# Patient Record
Sex: Male | Born: 1940 | Race: White | Hispanic: No | Marital: Married | Smoking: Former smoker
Health system: Southern US, Community
[De-identification: ages and names within clinical notes are randomized; demographics above are authoritative.]

## PROBLEM LIST (undated history)

## (undated) DIAGNOSIS — I509 Heart failure, unspecified: Secondary | ICD-10-CM

## (undated) DIAGNOSIS — I7409 Other arterial embolism and thrombosis of abdominal aorta: Secondary | ICD-10-CM

## (undated) DIAGNOSIS — I679 Cerebrovascular disease, unspecified: Secondary | ICD-10-CM

## (undated) DIAGNOSIS — D510 Vitamin B12 deficiency anemia due to intrinsic factor deficiency: Secondary | ICD-10-CM

## (undated) DIAGNOSIS — Z87898 Personal history of other specified conditions: Secondary | ICD-10-CM

## (undated) DIAGNOSIS — E785 Hyperlipidemia, unspecified: Secondary | ICD-10-CM

## (undated) DIAGNOSIS — I2510 Atherosclerotic heart disease of native coronary artery without angina pectoris: Secondary | ICD-10-CM

## (undated) DIAGNOSIS — I739 Peripheral vascular disease, unspecified: Secondary | ICD-10-CM

## (undated) DIAGNOSIS — E871 Hypo-osmolality and hyponatremia: Secondary | ICD-10-CM

## (undated) DIAGNOSIS — I10 Essential (primary) hypertension: Secondary | ICD-10-CM

## (undated) HISTORY — PX: CAROTID ENDARTERECTOMY: SUR193

## (undated) HISTORY — DX: Cerebrovascular disease, unspecified: I67.9

## (undated) HISTORY — DX: Other arterial embolism and thrombosis of abdominal aorta: I74.09

## (undated) HISTORY — DX: Heart failure, unspecified: I50.9

## (undated) HISTORY — PX: ELBOW SURGERY: SHX618

## (undated) HISTORY — PX: CARDIAC CATHETERIZATION: SHX172

## (undated) HISTORY — DX: Vitamin B12 deficiency anemia due to intrinsic factor deficiency: D51.0

## (undated) HISTORY — DX: Hypo-osmolality and hyponatremia: E87.1

## (undated) HISTORY — DX: Peripheral vascular disease, unspecified: I73.9

## (undated) HISTORY — DX: Essential (primary) hypertension: I10

## (undated) HISTORY — DX: Hyperlipidemia, unspecified: E78.5

## (undated) HISTORY — DX: Atherosclerotic heart disease of native coronary artery without angina pectoris: I25.10

## (undated) HISTORY — DX: Personal history of other specified conditions: Z87.898

## (undated) HISTORY — PX: KNEE SURGERY: SHX244

---

## 1992-12-27 HISTORY — PX: CORONARY ARTERY BYPASS GRAFT: SHX141

## 2004-12-27 HISTORY — PX: ILIAC ARTERY STENT: SHX1786

## 2005-02-16 ENCOUNTER — Ambulatory Visit: Payer: Self-pay | Admitting: Surgery

## 2005-03-10 ENCOUNTER — Ambulatory Visit: Payer: Self-pay | Admitting: Vascular Surgery

## 2005-03-11 ENCOUNTER — Ambulatory Visit: Payer: Self-pay | Admitting: Vascular Surgery

## 2005-03-19 ENCOUNTER — Ambulatory Visit: Payer: Self-pay | Admitting: Vascular Surgery

## 2010-03-20 ENCOUNTER — Inpatient Hospital Stay: Payer: Self-pay | Admitting: Orthopedic Surgery

## 2010-03-23 ENCOUNTER — Inpatient Hospital Stay: Payer: Self-pay | Admitting: Surgery

## 2010-11-02 ENCOUNTER — Ambulatory Visit: Payer: Self-pay | Admitting: Unknown Physician Specialty

## 2010-11-03 ENCOUNTER — Ambulatory Visit: Payer: Self-pay | Admitting: Orthopedic Surgery

## 2010-12-23 ENCOUNTER — Inpatient Hospital Stay: Payer: Self-pay | Admitting: General Surgery

## 2010-12-23 ENCOUNTER — Ambulatory Visit: Payer: Self-pay | Admitting: Internal Medicine

## 2010-12-27 HISTORY — PX: CHOLECYSTECTOMY: SHX55

## 2011-06-15 IMAGING — CR DG KNEE 1-2V*L*
1 series · 2 of 2 positions shown · non-contrast
Comparison: none

REASON FOR EXAM: post op
COMMENTS:   Bedside (portable):Y

[Series 1: view not recorded · 0.17mm/px · 2 of 2 slices shown]
[im 1/2]
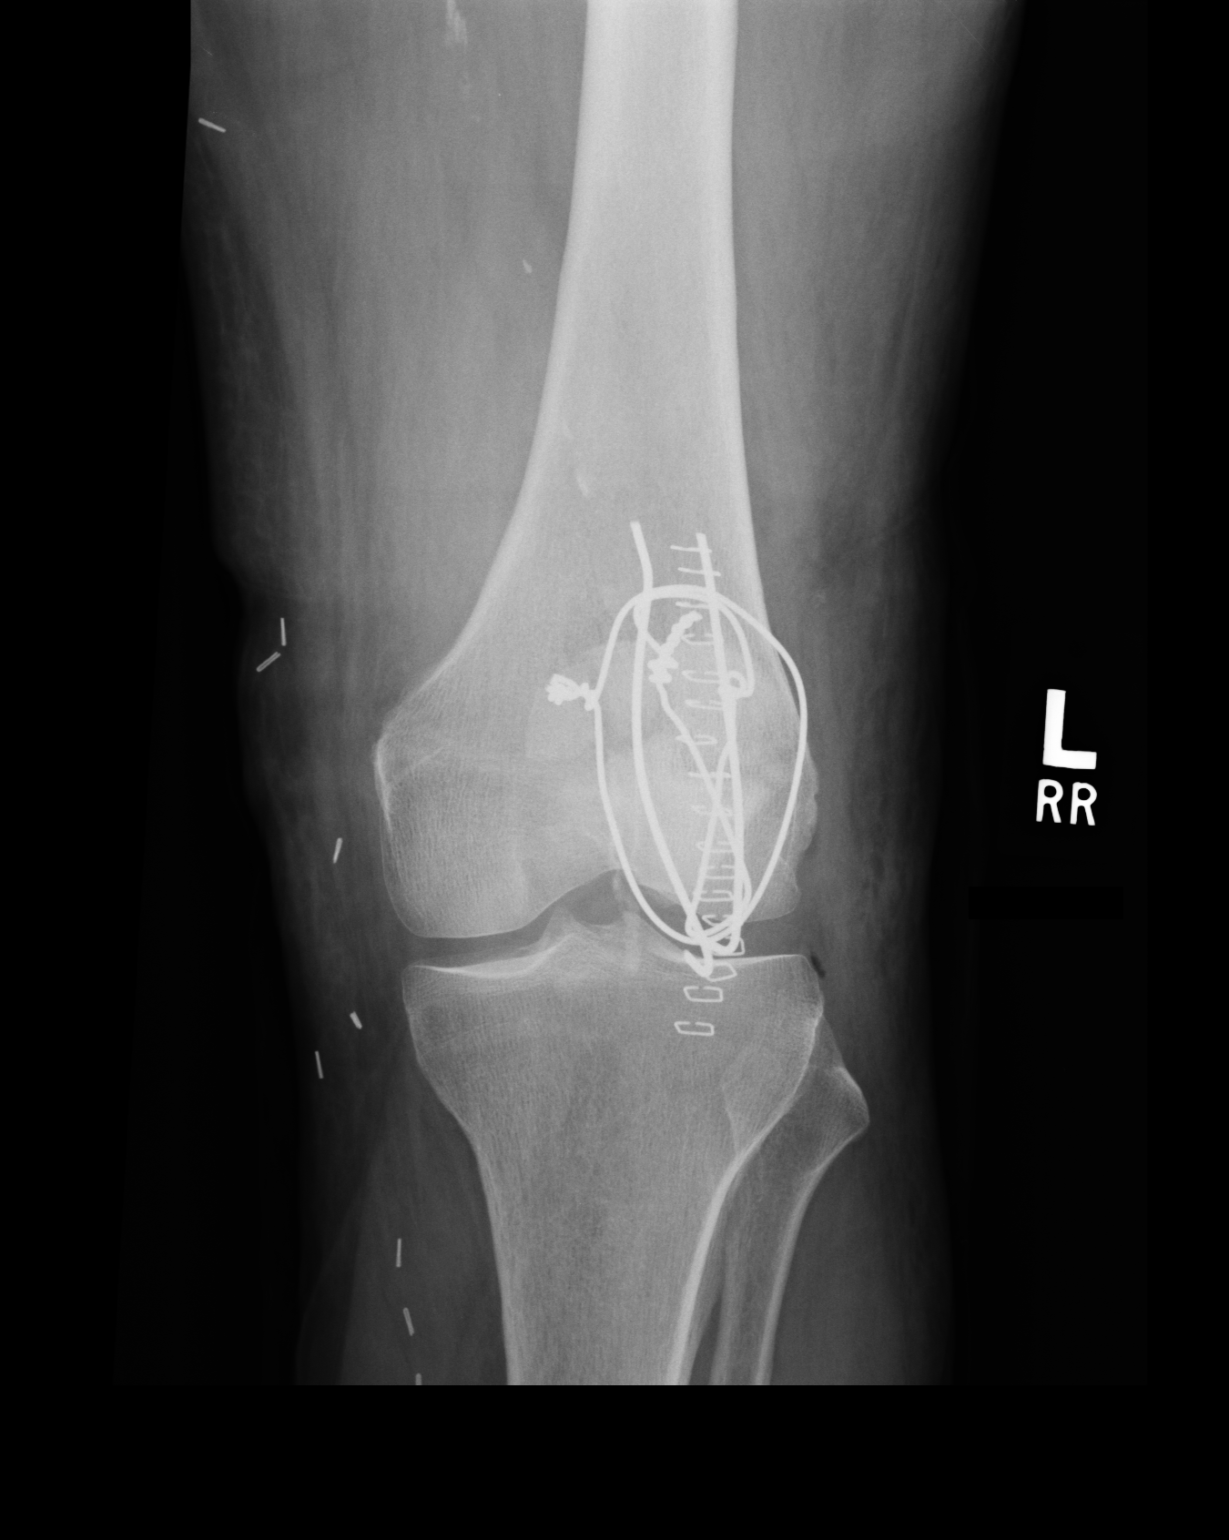
[im 2/2]
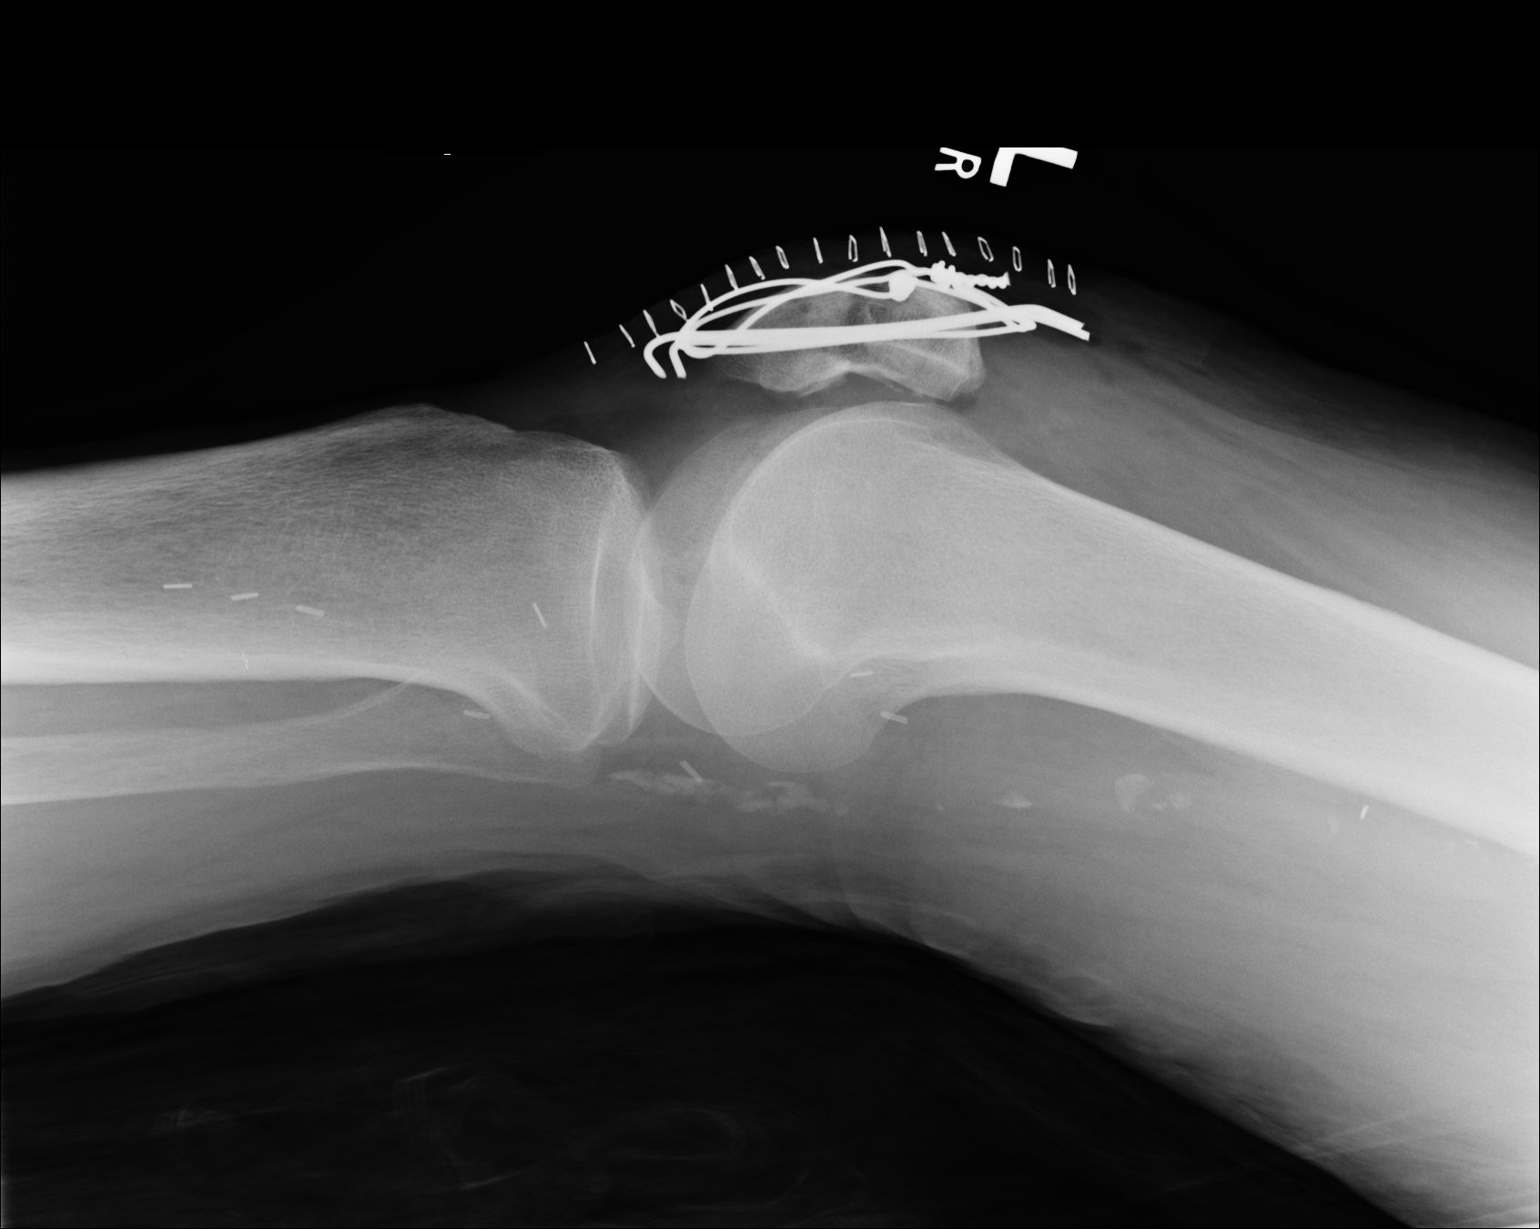

[2 of 2 positions shown; findings below may reference images not displayed]

PROCEDURE:     DXR - DXR KNEE LEFT AP AND LATERAL  - March 20, 2010  [DATE]

RESULT:     There has been wire fixation of the patient's patellar fracture.
Hardware appears intact. The remaining osseous structures appear gross
unremarkable. Skin staples are appreciated about the anterior aspect of the
knee.
IMPRESSION: Open reduction internal fixation of a left knee fracture. The remainder of
the interpretation will be left to the performing physician.

## 2012-03-19 IMAGING — CT CT ABD-PELV W/ CM
1 of 2 series · 15 of 32 positions shown, 19 images · IV contrast (isovue)
Comparison: None

REASON FOR EXAM: CR [id number]  RLQ pain eval appendicitis
COMMENTS:

PROCEDURE:     CT  - CT ABDOMEN / PELVIS  W  - December 23, 2010  [DATE]
RESULT:     History: Right lower quadrant pain
TECHNIQUE: Multiple axial images of the abdomen and pelvis were performed
from the lung bases to the pubic symphysis, with p.o. contrast and with 85
mL of Isovue 370 intravenous contrast.

[Series 2: soft tissue · axial · 0.72mm/px · z∈[+110,+538]mm · 15 of 155 slices shown, 19 images]
[im 6/155  soft-tissue]
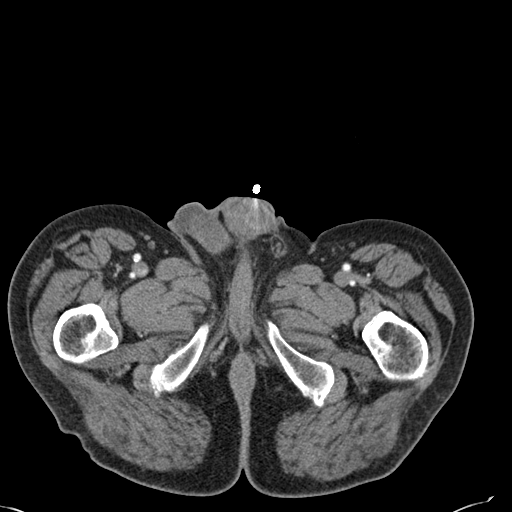
[im 6/155  bone]
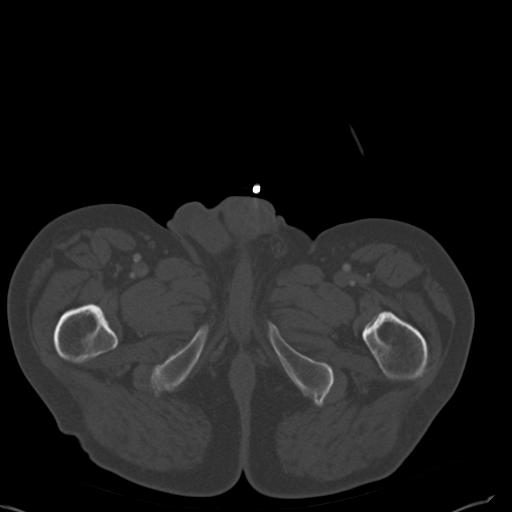
[im 18/155  soft-tissue]
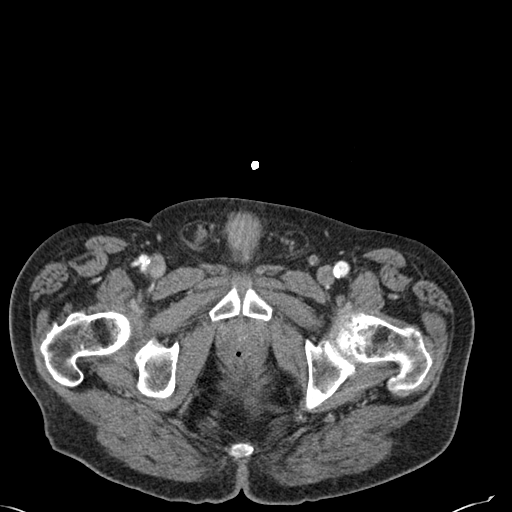
[im 30/155  soft-tissue]
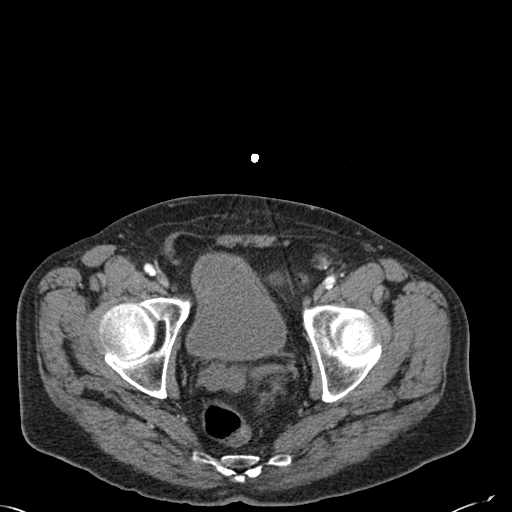
[im 42/155  soft-tissue]
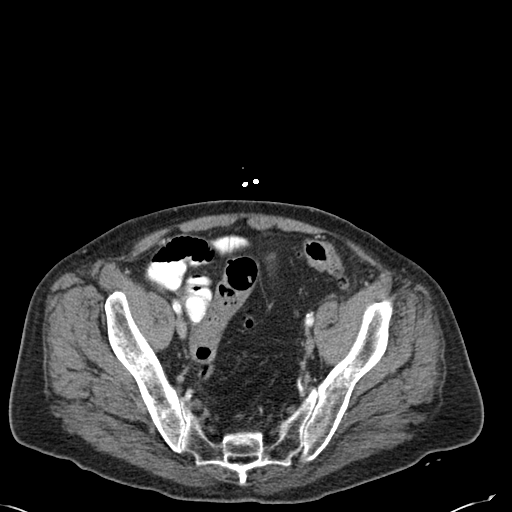
[im 54/155  soft-tissue]
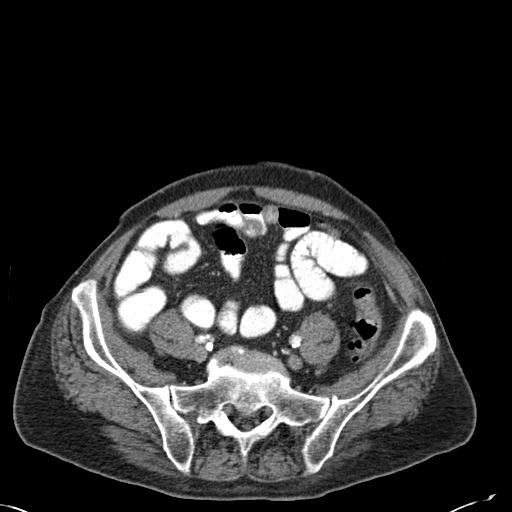
[im 66/155  soft-tissue]
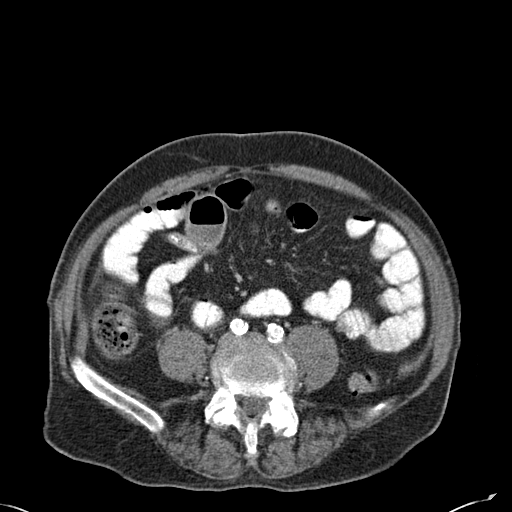
[im 78/155  soft-tissue]
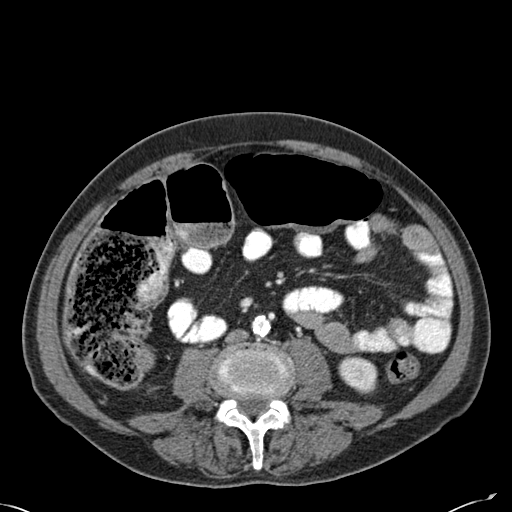
[im 89/155  soft-tissue]
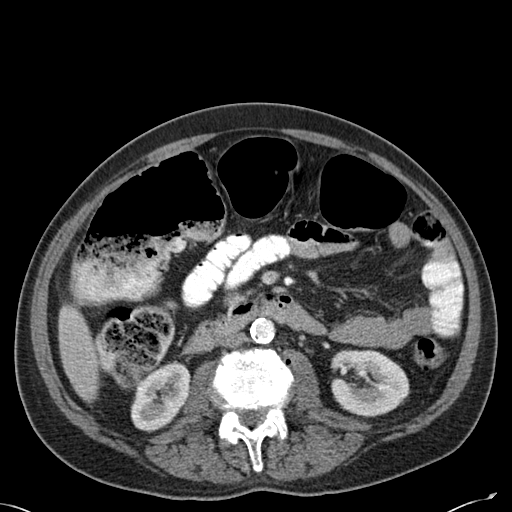
[im 101/155  soft-tissue]
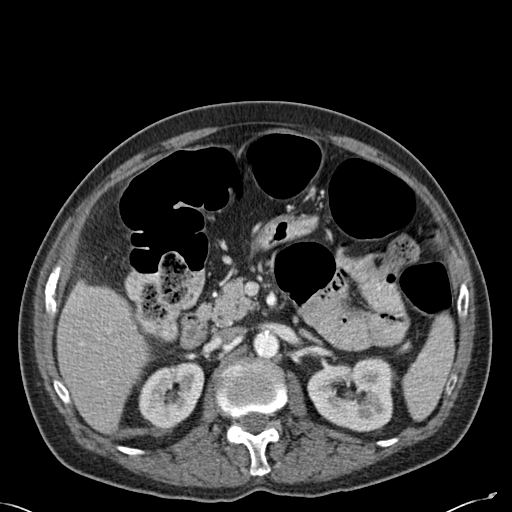
[im 101/155  bone]
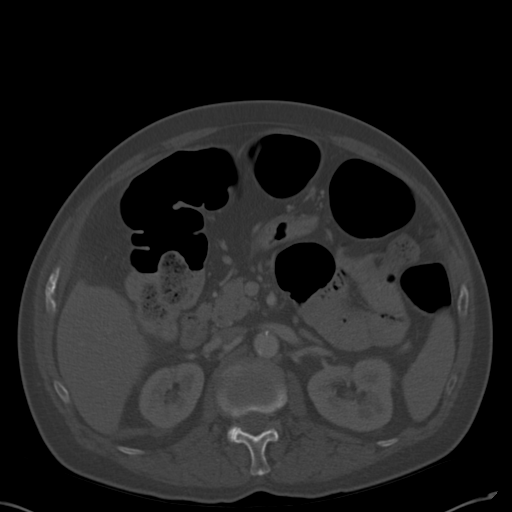
[im 113/155  soft-tissue]
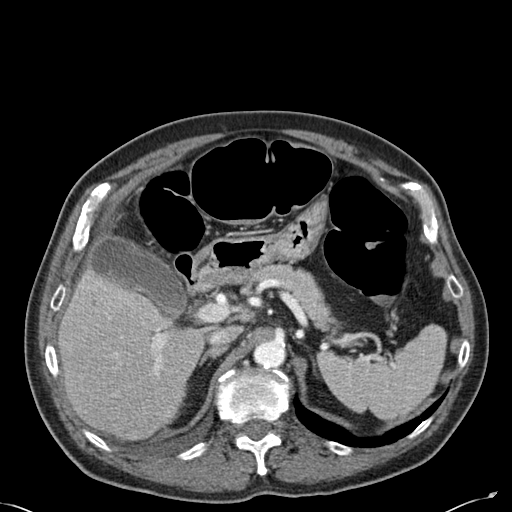
[im 125/155  soft-tissue]
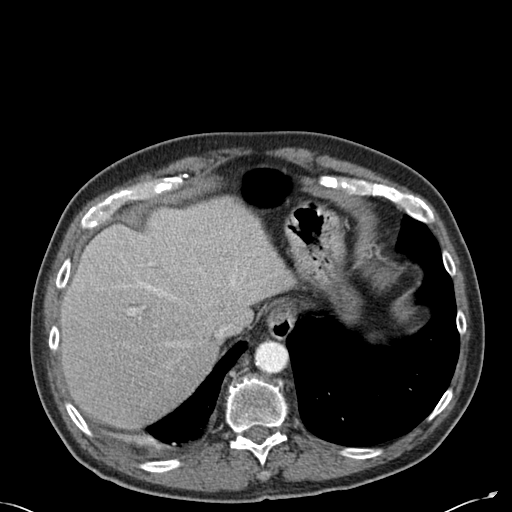
[im 131/155  lung]
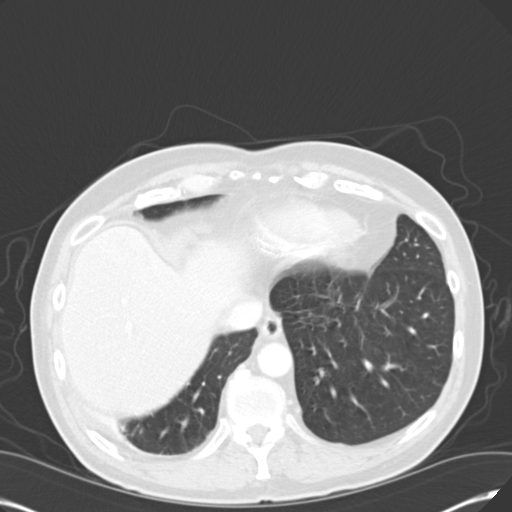
[im 137/155  soft-tissue]
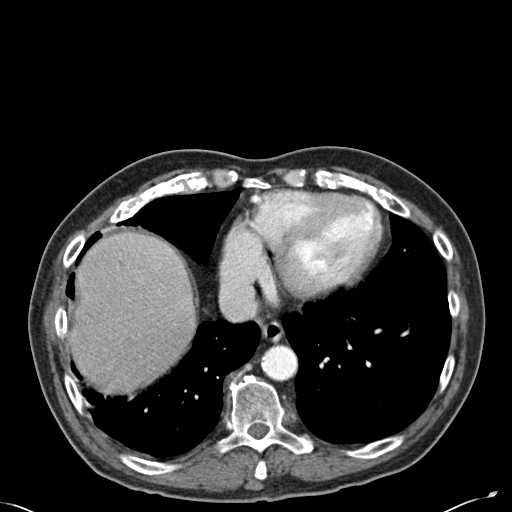
[im 137/155  lung]
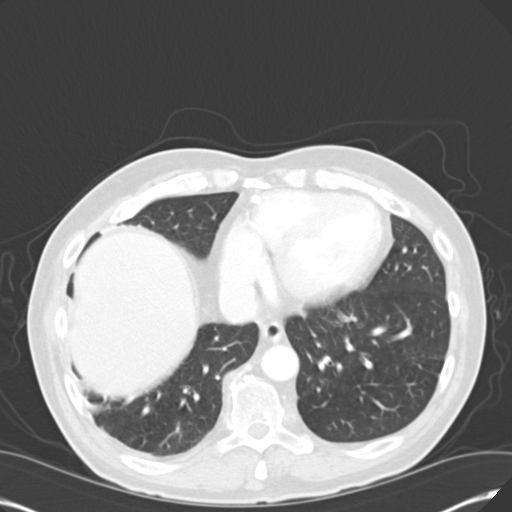
[im 143/155  lung]
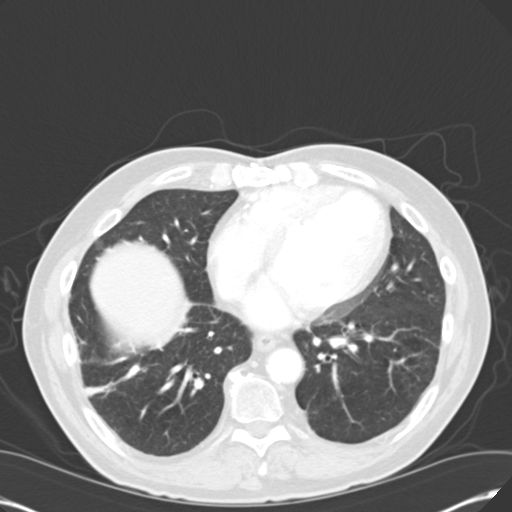
[im 149/155  soft-tissue]
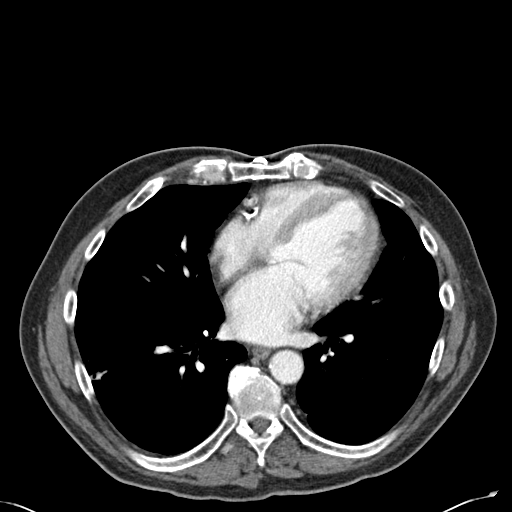
[im 149/155  lung]
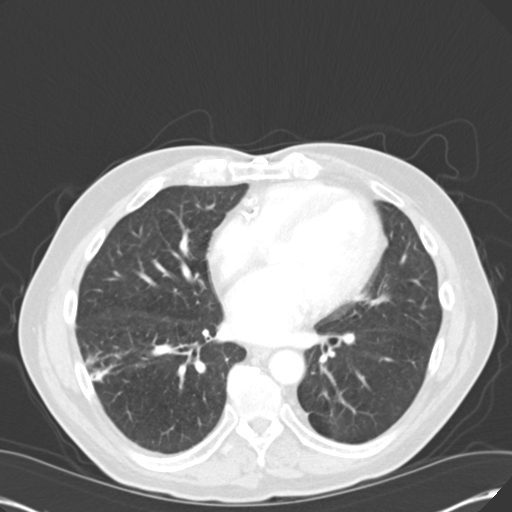

[15 of 32 positions shown; findings below may reference images not displayed]

FINDINGS: Small right pleural effusion. There is no pneumothorax. The heart size is
normal.

The liver demonstrates no focal abnormality. There is no intrahepatic or
extrahepatic biliary ductal dilatation. The gallbladder is distended with
mild haziness surrounding the gallbladder.. The spleen demonstrates no focal
abnormality. The kidneys, adrenal glands, and pancreas are normal. The
bladder is unremarkable.

There is gaseous distention of the ascending, transverse and proximal
descending colon with a relative change in caliber at the mid descending
colon without an obvious mass. There is diverticulosis without evidence of
diverticulitis. No normal nor abnormal appendix is identified. There is no
pneumoperitoneum, pneumatosis, or portal venous gas. There is no abdominal
or pelvic free fluid. There is no lymphadenopathy.

The abdominal aorta is normal in caliber with atherosclerosis.

The osseous structures are unremarkable.
IMPRESSION: 1. Distended gallbladder with mild haziness surrounding the gallbladder as
can be seen with cholecystitis. Recommend further evaluation with a right
upper quadrant ultrasound.

2. No normal nor abnormal appendix is identified. There are no secondary
findings to suggest acute appendicitis.

3. Small right pleural effusion.

4.There is gaseous distention of the ascending, transverse and proximal
descending colon with a relative change in caliber at the mid descending
colon without an obvious mass. Recommend correlation with a coloscopy.

## 2015-06-24 ENCOUNTER — Telehealth: Payer: Self-pay | Admitting: Surgery

## 2015-06-24 NOTE — Telephone Encounter (Signed)
I have called pt to reschedule appt for 07/11/15 with Dr Michela PitcherEly. Dr Michela PitcherEly will be out of the office. No answer. I was able to leave a VM.

## 2015-07-11 ENCOUNTER — Ambulatory Visit: Payer: Self-pay | Admitting: Surgery

## 2015-07-21 ENCOUNTER — Other Ambulatory Visit: Payer: Self-pay | Admitting: *Deleted

## 2015-07-21 ENCOUNTER — Encounter: Payer: Self-pay | Admitting: *Deleted

## 2015-07-22 ENCOUNTER — Ambulatory Visit (INDEPENDENT_AMBULATORY_CARE_PROVIDER_SITE_OTHER): Payer: Medicare Other | Admitting: Surgery

## 2015-07-22 ENCOUNTER — Encounter: Payer: Self-pay | Admitting: Surgery

## 2015-07-22 ENCOUNTER — Encounter (INDEPENDENT_AMBULATORY_CARE_PROVIDER_SITE_OTHER): Payer: Self-pay

## 2015-07-22 VITALS — BP 141/76 | HR 67 | Temp 97.8°F | Ht 65.0 in | Wt 160.0 lb

## 2015-07-22 DIAGNOSIS — L729 Follicular cyst of the skin and subcutaneous tissue, unspecified: Secondary | ICD-10-CM | POA: Diagnosis not present

## 2015-07-22 NOTE — Patient Instructions (Signed)
Call our office with any further questions or concerns.   No need to follow-up unless your symptoms change or this cyst becomes more bothersome to you.

## 2015-07-22 NOTE — Progress Notes (Signed)
  Surgical Consultation  07/22/2015  Roy Macdonald is an 74 y.o. male. With a small skin cyst behind his right ear.  Chief Complaint  Patient presents with  . Cyst    Behind Right Ear     HPI: He noted several months ago development of a small nodule behind his right ear. He had a previous skin cyst removed there over 50 years ago. This new lesion arose within the last couple of months. It is nontender has not been infected and has not drained any blood or purulent material. He wanted a second opinion on possible excision.  Past Medical History  Diagnosis Date  . CAD (coronary artery disease)   . Hypertension   . Hyperlipidemia   . Peripheral vascular disease   . Cerebrovascular disease      bilateral carotid stenosis  . History of seizures   . Pernicious anemia   . Congestive heart failure   . Hyponatremia   . Aorto-iliac disease     Past Surgical History  Procedure Laterality Date  . Cardiac catheterization    . Coronary artery bypass graft  1994    4 vessels  . Knee surgery    . Elbow surgery    . Cholecystectomy  2012  . Carotid endarterectomy Bilateral 2012, 2013  . Iliac artery stent  2006    Family History  Problem Relation Age of Onset  . Hypertension Mother   . Heart disease Mother   . Heart disease Father     Social History:  reports that he quit smoking about 4 years ago. He has never used smokeless tobacco. He reports that he does not drink alcohol or use illicit drugs.  Allergies: No Known Allergies  Medications reviewed.     ROS he has no other new complaints at the present time. 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     BP 141/76 mmHg  Pulse 67  Temp(Src) 97.8 F (36.6 C) (Oral)  Ht  (1.651 m)  Wt 160 lb (72.576 kg)  BMI 26.63 kg/m2  Physical Exam the lesion on his ear is approximately 2 cm long and 1 cm wide. It is easily movable and does not appear to be  attached to the deep tissue. There is no evidence of any infection or surrounding erythema.    No results found for this or any previous visit (from the past 48 hour(s)). No results found.  Assessment/Plan: 1. Skin cyst We talked about the options. It would be fairly simple to remove this but in view of its location I would recommend a same day surgery procedure. He did not want to go through that much surgery. I told him I think this area is benign. He doesn't have any further symptoms I think it reasonable to simply observe. We will see him back again as necessary.   Tiney Rouge III dermatitis

## 2016-02-25 DEATH — deceased
